# Patient Record
Sex: Male | Born: 1985
Health system: Southern US, Community
[De-identification: ages and names within clinical notes are randomized; demographics above are authoritative.]

---

## 2019-10-18 ENCOUNTER — Ambulatory Visit: Payer: Self-pay | Attending: Internal Medicine

## 2019-10-18 DIAGNOSIS — Z23 Encounter for immunization: Secondary | ICD-10-CM

## 2019-10-18 NOTE — Progress Notes (Signed)
   Covid-19 Vaccination [facility]  Name:  Wagner Tanzi    MRN: [id number] DOB: 1985/12/21  10/18/2019  Mr. Beckem Tomberlin was observed post Covid-19 immunization for 15 minutes without incident. He was provided with Vaccine Information Sheet and instruction to access the V-Safe system.   Mr. Asa Baudoin was instructed to call 911 with any severe reactions post vaccine: Marland Kitchen Difficulty breathing  . Swelling of face and throat  . A fast heartbeat  . A bad rash all over body  . Dizziness and weakness

## 2020-06-24 ENCOUNTER — Other Ambulatory Visit: Payer: Self-pay

## 2020-06-24 ENCOUNTER — Ambulatory Visit: Payer: 59 | Admitting: Internal Medicine

## 2020-06-24 ENCOUNTER — Encounter: Payer: Self-pay | Admitting: Internal Medicine

## 2020-06-24 VITALS — BP 123/75 | HR 61 | Temp 97.7°F | Resp 18 | Ht 67.0 in | Wt 168.0 lb

## 2020-06-24 DIAGNOSIS — Z1159 Encounter for screening for other viral diseases: Secondary | ICD-10-CM

## 2020-06-24 DIAGNOSIS — Z7689 Persons encountering health services in other specified circumstances: Secondary | ICD-10-CM

## 2020-06-24 DIAGNOSIS — R748 Abnormal levels of other serum enzymes: Secondary | ICD-10-CM

## 2020-06-24 NOTE — Progress Notes (Signed)
New Patient Office Visit  Subjective:  Patient ID: Juan Caldwell, male    DOB: 12/26/1985  Age: 35 y.o. MRN: [id number]  CC:  Chief Complaint  Patient presents with   New Patient (Initial Visit)    New patient has been awhile since  having a pcp just establishing care     HPI Juan Caldwell is a 35 year old male with no significant PMH who presents for establishing care.  He has been doing well overall. Reports h/o elevated liver enzymes and NAFLD in the past, but improved later with diet modification. Denies any nausea, vomiting, constipation or diarrhea.  He is up-to-date with COVID vaccine.  History reviewed. No pertinent past medical history.  History reviewed. No pertinent surgical history.  History reviewed. No pertinent family history.  Social History   Socioeconomic History   Marital status: Married    Spouse name: Not on file   Number of children: Not on file   Years of education: Not on file   Highest education level: Not on file  Occupational History   Not on file  Tobacco Use   Smoking status: Never   Smokeless tobacco: Never  Substance and Sexual Activity   Alcohol use: Not Currently   Drug use: Never   Sexual activity: Yes  Other Topics Concern   Not on file  Social History Narrative   Not on file   Social Determinants of Health   Financial Resource Strain: Not on file  Food Insecurity: Not on file  Transportation Needs: Not on file  Physical Activity: Not on file  Stress: Not on file  Social Connections: Not on file  Intimate Partner Violence: Not on file    ROS Review of Systems  Constitutional:  Negative for chills and fever.  HENT:  Negative for congestion and sore throat.   Eyes:  Negative for pain and discharge.  Respiratory:  Negative for cough and shortness of breath.   Cardiovascular:  Negative for chest pain and palpitations.  Gastrointestinal:  Negative for constipation, diarrhea, nausea and vomiting.   Endocrine: Negative for polydipsia and polyuria.  Genitourinary:  Negative for dysuria and hematuria.  Musculoskeletal:  Negative for neck pain and neck stiffness.  Skin:  Negative for rash.  Neurological:  Negative for dizziness, weakness, numbness and headaches.  Psychiatric/Behavioral:  Negative for agitation and behavioral problems.    Objective:   Today's Vitals: BP 123/75 (BP Location: Left Arm, Patient Position: Sitting, Cuff Size: Normal)   Pulse 61   Temp 97.7 F (36.5 C) (Oral)   Resp 18   Ht $R'5\' 7"'fV$  (1.702 m)   Wt 168 lb (76.2 kg)   SpO2 97%   BMI 26.31 kg/m   Physical Exam Vitals reviewed.  Constitutional:      General: He is not in acute distress.    Appearance: He is not diaphoretic.  HENT:     Head: Normocephalic and atraumatic.     Nose: Nose normal.     Mouth/Throat:     Mouth: Mucous membranes are moist.  Eyes:     General: No scleral icterus.    Extraocular Movements: Extraocular movements intact.  Cardiovascular:     Rate and Rhythm: Normal rate and regular rhythm.     Pulses: Normal pulses.     Heart sounds: Normal heart sounds. No murmur heard. Pulmonary:     Breath sounds: Normal breath sounds. No wheezing or rales.  Musculoskeletal:     Cervical back: Neck supple. No tenderness.  Right lower leg: No edema.     Left lower leg: No edema.  Skin:    General: Skin is warm.     Findings: No rash.  Neurological:     General: No focal deficit present.     Mental Status: He is alert and oriented to person, place, and time.  Psychiatric:        Mood and Affect: Mood normal.        Behavior: Behavior normal.    Assessment & Plan:   Problem List Items Addressed This Visit       Other   Encounter to establish care - Primary    Care established History and medications reviewed with the patient       Relevant Orders   CBC with Differential/Platelet   CMP14+EGFR   Lipid panel   TSH   Hemoglobin A1c   Elevated liver enzymes    Reports  h/o transaminitis and NAFLD, later improved with diet modification Recheck CMP       Other Visit Diagnoses     Need for hepatitis C screening test       Relevant Orders   Hepatitis C Antibody       No outpatient encounter medications on file as of 06/24/2020.   No facility-administered encounter medications on file as of 06/24/2020.    Follow-up: Return in about 1 year (around 06/24/2021) for Annual physical.   Lindell Spar, MD

## 2020-06-24 NOTE — Assessment & Plan Note (Signed)
Care established History and medications reviewed with the patient 

## 2020-06-24 NOTE — Patient Instructions (Signed)
Please continue to follow heart healthy diet.  Please get fasting blood tests done within a week.

## 2020-06-24 NOTE — Assessment & Plan Note (Signed)
Reports h/o transaminitis and NAFLD, later improved with diet modification Recheck CMP

## 2020-06-27 ENCOUNTER — Ambulatory Visit: Payer: Self-pay | Admitting: Internal Medicine

## 2020-07-04 ENCOUNTER — Ambulatory Visit (HOSPITAL_BASED_OUTPATIENT_CLINIC_OR_DEPARTMENT_OTHER): Payer: Self-pay | Admitting: Family Medicine

## 2020-07-04 ENCOUNTER — Other Ambulatory Visit (HOSPITAL_COMMUNITY): Payer: Self-pay

## 2020-07-04 DIAGNOSIS — Z3009 Encounter for other general counseling and advice on contraception: Secondary | ICD-10-CM | POA: Diagnosis not present

## 2020-07-04 MED ORDER — DIAZEPAM 10 MG PO TABS
ORAL_TABLET | ORAL | 0 refills | Status: DC
Start: 2020-07-04 — End: 2021-08-05
  Filled 2020-07-04: qty 2, 1d supply, fill #0

## 2020-08-22 ENCOUNTER — Other Ambulatory Visit (HOSPITAL_COMMUNITY): Payer: Self-pay

## 2020-08-22 DIAGNOSIS — Z302 Encounter for sterilization: Secondary | ICD-10-CM | POA: Diagnosis not present

## 2020-08-22 MED ORDER — TRAMADOL HCL 50 MG PO TABS
ORAL_TABLET | ORAL | 0 refills | Status: DC
Start: 2020-08-22 — End: 2021-08-05
  Filled 2020-08-22: qty 10, 2d supply, fill #0

## 2020-09-01 ENCOUNTER — Other Ambulatory Visit (HOSPITAL_COMMUNITY): Payer: Self-pay

## 2020-09-01 DIAGNOSIS — Z7689 Persons encountering health services in other specified circumstances: Secondary | ICD-10-CM | POA: Diagnosis not present

## 2020-09-01 DIAGNOSIS — Z1159 Encounter for screening for other viral diseases: Secondary | ICD-10-CM | POA: Diagnosis not present

## 2020-09-02 LAB — CBC WITH DIFFERENTIAL/PLATELET
Basophils Absolute: 0.1 10*3/uL (ref 0.0–0.2)
Basos: 1 %
EOS (ABSOLUTE): 0.2 10*3/uL (ref 0.0–0.4)
Eos: 3 %
Hematocrit: 44.3 % (ref 37.5–51.0)
Hemoglobin: 16.2 g/dL (ref 13.0–17.7)
Immature Grans (Abs): 0 10*3/uL (ref 0.0–0.1)
Immature Granulocytes: 0 %
Lymphocytes Absolute: 2.1 10*3/uL (ref 0.7–3.1)
Lymphs: 34 %
MCH: 30.3 pg (ref 26.6–33.0)
MCHC: 36.6 g/dL — ABNORMAL HIGH (ref 31.5–35.7)
MCV: 83 fL (ref 79–97)
Monocytes Absolute: 0.6 10*3/uL (ref 0.1–0.9)
Monocytes: 10 %
Neutrophils Absolute: 3.2 10*3/uL (ref 1.4–7.0)
Neutrophils: 52 %
Platelets: 252 10*3/uL (ref 150–450)
RBC: 5.34 x10E6/uL (ref 4.14–5.80)
RDW: 12.2 % (ref 11.6–15.4)
WBC: 6.1 10*3/uL (ref 3.4–10.8)

## 2020-09-02 LAB — CMP14+EGFR
ALT: 18 IU/L (ref 0–44)
AST: 18 IU/L (ref 0–40)
Albumin/Globulin Ratio: 2.2 (ref 1.2–2.2)
Albumin: 4.8 g/dL (ref 4.0–5.0)
Alkaline Phosphatase: 83 IU/L (ref 44–121)
BUN/Creatinine Ratio: 15 (ref 9–20)
BUN: 15 mg/dL (ref 6–20)
Bilirubin Total: 0.7 mg/dL (ref 0.0–1.2)
CO2: 24 mmol/L (ref 20–29)
Calcium: 10 mg/dL (ref 8.7–10.2)
Chloride: 103 mmol/L (ref 96–106)
Creatinine, Ser: 0.98 mg/dL (ref 0.76–1.27)
Globulin, Total: 2.2 g/dL (ref 1.5–4.5)
Glucose: 96 mg/dL (ref 65–99)
Potassium: 4.2 mmol/L (ref 3.5–5.2)
Sodium: 136 mmol/L (ref 134–144)
Total Protein: 7 g/dL (ref 6.0–8.5)
eGFR: 105 mL/min/{1.73_m2} (ref >59)

## 2020-09-02 LAB — LIPID PANEL
Chol/HDL Ratio: 3.2 ratio (ref 0.0–5.0)
Cholesterol, Total: 149 mg/dL (ref 100–199)
HDL: 46 mg/dL (ref >39)
LDL Chol Calc (NIH): 88 mg/dL (ref 0–99)
Triglycerides: 74 mg/dL (ref 0–149)
VLDL Cholesterol Cal: 15 mg/dL (ref 5–40)

## 2020-09-02 LAB — HEMOGLOBIN A1C
Est. average glucose Bld gHb Est-mCnc: 100 mg/dL
Hgb A1c MFr Bld: 5.1 % (ref 4.8–5.6)

## 2020-09-02 LAB — TSH: TSH: 1.42 u[IU]/mL (ref 0.450–4.500)

## 2020-10-24 DIAGNOSIS — H5213 Myopia, bilateral: Secondary | ICD-10-CM | POA: Diagnosis not present

## 2020-10-24 DIAGNOSIS — H52203 Unspecified astigmatism, bilateral: Secondary | ICD-10-CM | POA: Diagnosis not present

## 2020-12-08 ENCOUNTER — Encounter: Payer: Self-pay | Admitting: Internal Medicine

## 2020-12-09 ENCOUNTER — Other Ambulatory Visit: Payer: Self-pay | Admitting: Internal Medicine

## 2020-12-09 ENCOUNTER — Other Ambulatory Visit (HOSPITAL_COMMUNITY): Payer: Self-pay

## 2020-12-09 DIAGNOSIS — K219 Gastro-esophageal reflux disease without esophagitis: Secondary | ICD-10-CM

## 2020-12-09 DIAGNOSIS — L209 Atopic dermatitis, unspecified: Secondary | ICD-10-CM

## 2020-12-09 MED ORDER — MOMETASONE FUROATE 0.1 % EX CREA
1.0000 "application " | TOPICAL_CREAM | Freq: Every day | CUTANEOUS | 0 refills | Status: DC
  Filled 2020-12-09 – 2020-12-10 (×2): qty 45, 30d supply, fill #0

## 2020-12-09 MED ORDER — OMEPRAZOLE 20 MG PO CPDR
20.0000 mg | DELAYED_RELEASE_CAPSULE | Freq: Every day | ORAL | 3 refills | Status: DC
  Filled 2020-12-09 – 2020-12-10 (×2): qty 90, 90d supply, fill #0

## 2020-12-10 ENCOUNTER — Other Ambulatory Visit (HOSPITAL_COMMUNITY): Payer: Self-pay

## 2021-04-30 ENCOUNTER — Telehealth: Payer: Self-pay

## 2021-04-30 NOTE — Telephone Encounter (Signed)
Patient called in to see if he could be seen tomorrow I offered him a 8 or 1030 at this time he stated that he has patients and could only do after 12 tomorrow. He would like to know if we have any cancellations if he can get a call to move his appt  ? ?Please advise   ?

## 2021-05-01 ENCOUNTER — Ambulatory Visit (HOSPITAL_BASED_OUTPATIENT_CLINIC_OR_DEPARTMENT_OTHER): Payer: 59 | Admitting: Orthopaedic Surgery

## 2021-05-01 ENCOUNTER — Ambulatory Visit (HOSPITAL_BASED_OUTPATIENT_CLINIC_OR_DEPARTMENT_OTHER)
Admission: RE | Admit: 2021-05-01 | Discharge: 2021-05-01 | Disposition: A | Discharge status: Home / Self Care | Payer: 59 | Source: Ambulatory Visit | Attending: Orthopaedic Surgery | Admitting: Orthopaedic Surgery

## 2021-05-01 ENCOUNTER — Other Ambulatory Visit (HOSPITAL_BASED_OUTPATIENT_CLINIC_OR_DEPARTMENT_OTHER): Payer: Self-pay | Admitting: Orthopaedic Surgery

## 2021-05-01 DIAGNOSIS — M7631 Iliotibial band syndrome, right leg: Secondary | ICD-10-CM

## 2021-05-01 DIAGNOSIS — M25561 Pain in right knee: Secondary | ICD-10-CM

## 2021-05-01 MED ORDER — TRIAMCINOLONE ACETONIDE 40 MG/ML IJ SUSP
80.0000 mg | INTRAMUSCULAR | Status: AC | PRN
  Administered 2021-05-01: 80 mg via INTRA_ARTICULAR

## 2021-05-01 MED ORDER — LIDOCAINE HCL 1 % IJ SOLN
4.0000 mL | INTRAMUSCULAR | Status: AC | PRN
  Administered 2021-05-01: 4 mL

## 2021-05-01 NOTE — Progress Notes (Signed)
? ?                            ? ? ?Chief Complaint: Right knee pain ?  ? ? ?History of Present Illness:  ? ? ?Juan Caldwell is a 36 y.o. male presents with right which has been ongoing for the last several weeks.  He does not have a specific injury or incident.  He has a GI doctor in [place].  He is here today as he has been more recent recently playing tennis and has noted snapping and popping about the lateral aspect of the knee.  He states that there is pain and soreness associated with the popping.  Denies any history of knee injury.  He has been limited in his ability to play tennis as result of this knee pain. ? ? ? ?Surgical History:   ?None ? ?PMH/PSH/Family History/Social History/Meds/Allergies:   ?No past medical history on file. ?No past surgical history on file. ?Social History  ? ?Socioeconomic History  ?? Marital status: Married  ?  Spouse name: Not on file  ?? Number of children: Not on file  ?? Years of education: Not on file  ?? Highest education level: Not on file  ?Occupational History  ?? Not on file  ?Tobacco Use  ?? Smoking status: Never  ?? Smokeless tobacco: Never  ?Substance and Sexual Activity  ?? Alcohol use: Not Currently  ?? Drug use: Never  ?? Sexual activity: Yes  ?Other Topics Concern  ?? Not on file  ?Social History Narrative  ?? Not on file  ? ?Social Determinants of Health  ? ?Financial Resource Strain: Not on file  ?Food Insecurity: Not on file  ?Transportation Needs: Not on file  ?Physical Activity: Not on file  ?Stress: Not on file  ?Social Connections: Not on file  ? ?No family history on file. ?Not on File ?Current Outpatient Medications  ?Medication Sig Dispense Refill  ?? mometasone (ELOCON) 0.1 % cream Apply topically daily. 45 g 0  ?? omeprazole (PRILOSEC) 20 MG capsule Take 1 capsule (20 mg total) by mouth daily. 90 capsule 3  ?? diazepam (VALIUM) 10 MG tablet Take 2 tablets by mouth once 1 hour prior to procedure 2 tablet 0  ?? traMADol (ULTRAM) 50 MG  tablet Take 1 to 2 tablets by mouth every 6 hours 10 tablet 0  ? ?No current facility-administered medications for this visit.  ? ?No results found. ? ?Review of Systems:   ?A ROS was performed including pertinent positives and negatives as documented in the HPI. ? ?Physical Exam :   ?Constitutional: NAD and appears stated age ?Neurological: Alert and oriented ?Psych: Appropriate affect and cooperative ?There were no vitals taken for this visit.  ? ?Comprehensive Musculoskeletal Exam:   ? ?  ?Musculoskeletal Exam  ?Gait Normal  ?Alignment Normal  ? Right Left  ?Inspection Normal Normal  ?Palpation    ?Tenderness IT band laterally None  ?Crepitus None None  ?Effusion None None  ?Range of Motion    ?Extension 0 0  ?Flexion 135 135  ?Strength    ?Extension 5/5 5/5  ?Flexion 5/5 5/5  ?Ligament Exam     ?Generalized Laxity No No  ?Lachman Negative Negative   ?Pivot Shift Negative Negative  ?Anterior Drawer Negative Negative  ?Valgus at 0 Negative Negative  ?Valgus at 20 Negative Negative  ?Varus at 0 0 0  ?Varus at 20   0 0  ?Posterior  Drawer at 90 0 0  ?Vascular/Lymphatic Exam    ?Edema None None  ?Venous Stasis Changes No No  ?Distal Circulation Normal Normal  ?Neurologic    ?Light Touch Sensation Intact Intact  ?Special Tests: Positive palpable clicking as the knee is brought from flexion into extension about the IT band over the lateral femoral condyle  ? ? ? ?Imaging:   ?Xray (4 views right knee): ?Normal ? ? ? ?I personally reviewed and interpreted the radiographs. ? ? ?Assessment:   ?36 y.o. male with right knee IT band friction syndrome which has been ongoing as he has been more active and tennis.  At this time I have recommended IT band stretching by applying a tennis ball into the lateral knee/IT band.  I have also recommended a side to IT band ultrasound-guided injection which she would like to perform today.  I described that the typical course is self-limiting and that these typically resolve over time with  an injection.  He would like to try this today. ? ?Plan :   ? ?-Right knee ultrasound-guided injection performed today after verbal consent obtained ? ?Procedure Note ? ?Patient: Juan Caldwell             ?Date of Birth: Dec 23, 1985           ?MRN: [id number]             ?Visit Date: 05/01/2021 ? ?Procedures: ?Visit Diagnoses: No diagnosis found. ? ?Large Joint Inj on 05/01/2021 12:27 PM ?Indications: pain ?Details: 22 G 1.5 in needle, ultrasound-guided anterior approach ? ?Arthrogram: No ? ?Medications: 4 mL lidocaine 1 %; 80 mg triamcinolone acetonide 40 MG/ML ?Outcome: tolerated well, no immediate complications ?Procedure, treatment alternatives, risks and benefits explained, specific risks discussed. Consent was given by the patient. Immediately prior to procedure a time out was called to verify the correct patient, procedure, equipment, support staff and site/side marked as required. Patient was prepped and draped in the usual sterile fashion.  ? ? ? ? ? ? ? ? ?I personally saw and evaluated the patient, and participated in the management and treatment plan. ? ?Huel Cote, MD ?Attending Physician, Orthopedic Surgery ? ?This document was dictated using Conservation officer, historic buildings. A reasonable attempt at proof reading has been made to minimize errors. ?

## 2021-05-08 ENCOUNTER — Ambulatory Visit (HOSPITAL_BASED_OUTPATIENT_CLINIC_OR_DEPARTMENT_OTHER): Payer: 59 | Admitting: Orthopaedic Surgery

## 2021-06-30 ENCOUNTER — Encounter: Payer: 59 | Admitting: Internal Medicine

## 2021-07-13 ENCOUNTER — Encounter: Payer: 59 | Admitting: Internal Medicine

## 2021-08-05 ENCOUNTER — Ambulatory Visit (INDEPENDENT_AMBULATORY_CARE_PROVIDER_SITE_OTHER): Payer: 59 | Admitting: Internal Medicine

## 2021-08-05 ENCOUNTER — Encounter: Payer: Self-pay | Admitting: Internal Medicine

## 2021-08-05 VITALS — BP 118/84 | HR 64 | Resp 18 | Ht 67.0 in | Wt 164.2 lb

## 2021-08-05 DIAGNOSIS — E559 Vitamin D deficiency, unspecified: Secondary | ICD-10-CM | POA: Diagnosis not present

## 2021-08-05 DIAGNOSIS — K219 Gastro-esophageal reflux disease without esophagitis: Secondary | ICD-10-CM | POA: Diagnosis not present

## 2021-08-05 DIAGNOSIS — L309 Dermatitis, unspecified: Secondary | ICD-10-CM | POA: Diagnosis not present

## 2021-08-05 DIAGNOSIS — Z0001 Encounter for general adult medical examination with abnormal findings: Secondary | ICD-10-CM | POA: Insufficient documentation

## 2021-08-05 NOTE — Progress Notes (Signed)
Established Patient Office Visit  Subjective:  Patient ID: Juan Caldwell, male    DOB: December 15, 1985  Age: 36 y.o. MRN: [id number]  CC:  Chief Complaint  Patient presents with   Annual Exam    Annual exam     HPI Juan Caldwell is a 36 y.o. male with past medical history of GERD and eczema who presents for annual physical.  He reports rash over face area, near angle of lips and below lower lip. He has been applying Mometasone cream with some relief, but recurs. He also has rash over L > R knee, but no rash over elbows or extensor surfaces of UE or LE.  He has been doing well otherwise.      History reviewed. No pertinent past medical history.  History reviewed. No pertinent surgical history.  History reviewed. No pertinent family history.  Social History   Socioeconomic History   Marital status: Married    Spouse name: Not on file   Number of children: Not on file   Years of education: Not on file   Highest education level: Not on file  Occupational History   Not on file  Tobacco Use   Smoking status: Never   Smokeless tobacco: Never  Substance and Sexual Activity   Alcohol use: Not Currently   Drug use: Never   Sexual activity: Yes  Other Topics Concern   Not on file  Social History Narrative   Not on file   Social Determinants of Health   Financial Resource Strain: Not on file  Food Insecurity: Not on file  Transportation Needs: Not on file  Physical Activity: Not on file  Stress: Not on file  Social Connections: Not on file  Intimate Partner Violence: Not on file    Outpatient Medications Prior to Visit  Medication Sig Dispense Refill   mometasone (ELOCON) 0.1 % cream Apply topically daily. 45 g 0   omeprazole (PRILOSEC) 20 MG capsule Take 1 capsule (20 mg total) by mouth daily. 90 capsule 3   diazepam (VALIUM) 10 MG tablet Take 2 tablets by mouth once 1 hour prior to procedure 2 tablet 0   traMADol (ULTRAM) 50 MG tablet Take 1  to 2 tablets by mouth every 6 hours 10 tablet 0   No facility-administered medications prior to visit.    Not on File  ROS Review of Systems  Constitutional:  Negative for chills and fever.  HENT:  Negative for congestion and sore throat.   Eyes:  Negative for pain and discharge.  Respiratory:  Negative for cough and shortness of breath.   Cardiovascular:  Negative for chest pain and palpitations.  Gastrointestinal:  Negative for constipation, diarrhea, nausea and vomiting.  Endocrine: Negative for polydipsia and polyuria.  Genitourinary:  Negative for dysuria and hematuria.  Musculoskeletal:  Negative for neck pain and neck stiffness.  Skin:  Positive for rash.  Neurological:  Negative for dizziness, weakness, numbness and headaches.  Psychiatric/Behavioral:  Negative for agitation and behavioral problems.       Objective:    Physical Exam Vitals reviewed.  Constitutional:      General: He is not in acute distress.    Appearance: He is not diaphoretic.  HENT:     Head: Normocephalic and atraumatic.     Nose: Nose normal.     Mouth/Throat:     Mouth: Mucous membranes are moist.  Eyes:     General: No scleral icterus.    Extraocular Movements: Extraocular movements intact.  Cardiovascular:     Rate and Rhythm: Normal rate and regular rhythm.     Pulses: Normal pulses.     Heart sounds: Normal heart sounds. No murmur heard. Pulmonary:     Breath sounds: Normal breath sounds. No wheezing or rales.  Musculoskeletal:     Cervical back: Neck supple. No tenderness.     Right lower leg: No edema.     Left lower leg: No edema.  Skin:    General: Skin is warm.     Findings: Rash (Erythematous over face - near angles of lip and below lower lip) present.  Neurological:     General: No focal deficit present.     Mental Status: He is alert and oriented to person, place, and time.  Psychiatric:        Mood and Affect: Mood normal.        Behavior: Behavior normal.     BP  118/84 (BP Location: Right Arm, Patient Position: Sitting, Cuff Size: Normal)   Pulse 64   Resp 18   Ht _0  (1.702 m)   Wt 164 lb 3.2 oz (74.5 kg)   SpO2 99%   BMI 25.72 kg/m  Wt Readings from Last 3 Encounters:  08/05/21 164 lb 3.2 oz (74.5 kg)  06/24/20 168 lb (76.2 kg)    Lab Results  Component Value Date   TSH 1.420 09/01/2020   Lab Results  Component Value Date   WBC 6.1 09/01/2020   HGB 16.2 09/01/2020   HCT 44.3 09/01/2020   MCV 83 09/01/2020   PLT 252 09/01/2020   Lab Results  Component Value Date   NA 136 09/01/2020   K 4.2 09/01/2020   CO2 24 09/01/2020   GLUCOSE 96 09/01/2020   BUN 15 09/01/2020   CREATININE 0.98 09/01/2020   BILITOT 0.7 09/01/2020   ALKPHOS 83 09/01/2020   AST 18 09/01/2020   ALT 18 09/01/2020   PROT 7.0 09/01/2020   ALBUMIN 4.8 09/01/2020   CALCIUM 10.0 09/01/2020   EGFR 105 09/01/2020   Lab Results  Component Value Date   CHOL 149 09/01/2020   Lab Results  Component Value Date   HDL 46 09/01/2020   Lab Results  Component Value Date   LDLCALC 88 09/01/2020   Lab Results  Component Value Date   TRIG 74 09/01/2020   Lab Results  Component Value Date   CHOLHDL 3.2 09/01/2020   Lab Results  Component Value Date   HGBA1C 5.1 09/01/2020      Assessment & Plan:   Problem List Items Addressed This Visit       Digestive   Gastroesophageal reflux disease without esophagitis    Well-compared with Omeprazole        Musculoskeletal and Integument   Eczema    Slightly better with Mometasone Referred to Dermatology      Relevant Orders   Ambulatory referral to Dermatology     Other   Encounter for general adult medical examination with abnormal findings - Primary    Physical exam as documented. Fasting blood tests ordered.      Relevant Orders   VITAMIN D 25 Hydroxy (Vit-D Deficiency, Fractures)   TSH   Hemoglobin A1c   CMP14+EGFR   CBC with Differential/Platelet   Lipid Profile   Other Visit  Diagnoses     Vitamin D deficiency       Relevant Orders   VITAMIN D 25 Hydroxy (Vit-D Deficiency, Fractures)  No orders of the defined types were placed in this encounter.   Follow-up: Return if symptoms worsen or fail to improve.    Lindell Spar, MD

## 2021-08-05 NOTE — Assessment & Plan Note (Signed)
Physical exam as documented. Fasting blood tests ordered. 

## 2021-08-05 NOTE — Assessment & Plan Note (Signed)
Slightly better with Mometasone Referred to Dermatology

## 2021-08-05 NOTE — Assessment & Plan Note (Signed)
Well-compared with Omeprazole

## 2021-08-20 ENCOUNTER — Ambulatory Visit (INDEPENDENT_AMBULATORY_CARE_PROVIDER_SITE_OTHER): Payer: 59 | Admitting: Internal Medicine

## 2021-08-20 ENCOUNTER — Encounter: Payer: Self-pay | Admitting: Internal Medicine

## 2021-08-20 ENCOUNTER — Other Ambulatory Visit (HOSPITAL_COMMUNITY): Payer: Self-pay

## 2021-08-20 VITALS — BP 108/72 | HR 47 | Temp 97.5°F

## 2021-08-20 DIAGNOSIS — R109 Unspecified abdominal pain: Secondary | ICD-10-CM

## 2021-08-20 DIAGNOSIS — R102 Pelvic and perineal pain: Secondary | ICD-10-CM | POA: Diagnosis not present

## 2021-08-20 LAB — POCT URINALYSIS DIP (CLINITEK)
Bilirubin, UA: NEGATIVE
Blood, UA: NEGATIVE
Glucose, UA: NEGATIVE mg/dL
Ketones, POC UA: NEGATIVE mg/dL
Leukocytes, UA: NEGATIVE
Nitrite, UA: NEGATIVE
POC PROTEIN,UA: 100 — AB
Spec Grav, UA: 1.025 (ref 1.010–1.025)
Urobilinogen, UA: 1 E.U./dL
pH, UA: 6 (ref 5.0–8.0)

## 2021-08-20 MED ORDER — DICYCLOMINE HCL 10 MG PO CAPS
10.0000 mg | ORAL_CAPSULE | Freq: Three times a day (TID) | ORAL | 0 refills | Status: DC
  Filled 2021-08-20: qty 30, 8d supply, fill #0

## 2021-08-20 NOTE — Patient Instructions (Signed)
Please take Bentyl as needed for abdominal cramps.

## 2021-08-20 NOTE — Progress Notes (Signed)
Acute Office Visit  Subjective:    Patient ID: Stevenson Windmiller, male    DOB: October 24, 1985, 36 y.o.   MRN: [id number]  Chief Complaint  Patient presents with   Abdominal Pain    Starting 08-19-21 patient has lower abdomen toward the pelvic pain it comes and goes     HPI Patient is in today for complaint of suprapubic pain and abdominal cramping, sudden onset for the last 3-4 hours.  Pain is intermittent, worse with movement and is sharp, cramping.  He reports taking coffee last night, which is unusual for him.  He has had 2 BMs today, regular consistency.  Denies any melena or hematochezia.  Denies any dysuria, hematuria or scrotal pain.  Denies any nausea or vomiting.  History reviewed. No pertinent past medical history.  History reviewed. No pertinent surgical history.  History reviewed. No pertinent family history.  Social History   Socioeconomic History   Marital status: Married    Spouse name: Not on file   Number of children: Not on file   Years of education: Not on file   Highest education level: Not on file  Occupational History   Not on file  Tobacco Use   Smoking status: Never   Smokeless tobacco: Never  Substance and Sexual Activity   Alcohol use: Not Currently   Drug use: Never   Sexual activity: Yes  Other Topics Concern   Not on file  Social History Narrative   Not on file   Social Determinants of Health   Financial Resource Strain: Not on file  Food Insecurity: Not on file  Transportation Needs: Not on file  Physical Activity: Not on file  Stress: Not on file  Social Connections: Not on file  Intimate Partner Violence: Not on file    Outpatient Medications Prior to Visit  Medication Sig Dispense Refill   mometasone (ELOCON) 0.1 % cream Apply topically daily. 45 g 0   omeprazole (PRILOSEC) 20 MG capsule Take 1 capsule (20 mg total) by mouth daily. 90 capsule 3   No facility-administered medications prior to visit.    Not on  File  Review of Systems  Constitutional:  Negative for chills and fever.  Respiratory:  Negative for cough and shortness of breath.   Cardiovascular:  Negative for chest pain and palpitations.  Gastrointestinal:  Positive for abdominal pain. Negative for nausea and vomiting.  Genitourinary:  Negative for dysuria and hematuria.  Skin:  Negative for rash.       Objective:    Physical Exam Constitutional:      General: He is not in acute distress.    Appearance: He is not diaphoretic.  Cardiovascular:     Heart sounds: Normal heart sounds. No murmur heard. Pulmonary:     Breath sounds: Normal breath sounds. No wheezing or rales.  Abdominal:     Palpations: Abdomen is soft.     Tenderness: There is abdominal tenderness (Mild, suprapubic).  Neurological:     General: No focal deficit present.     Mental Status: He is alert and oriented to person, place, and time.  Psychiatric:        Mood and Affect: Mood normal.        Behavior: Behavior normal.     BP 108/72 (BP Location: Right Arm, Patient Position: Sitting, Cuff Size: Normal)   Pulse (!) 47   Temp (!) 97.5 F (36.4 C) (Temporal)   SpO2 99%  Wt Readings from Last 3 Encounters:  08/05/21  164 lb 3.2 oz (74.5 kg)  06/24/20 168 lb (76.2 kg)        Assessment & Plan:   Problem List Items Addressed This Visit    Visit Diagnoses     Abdominal cramps    -  Primary Unclear etiology UA reviewed as he had suprapubic pain, UA negative for LE or nitrite Regular Bms Bentyl as needed for abdominal cramps If persistent or worsening of symptoms, may need CT abdomen   Relevant Medications   dicyclomine (BENTYL) 10 MG capsule   Suprapubic pain       Relevant Orders   POCT URINALYSIS DIP (CLINITEK) (Completed)        Meds ordered this encounter  Medications   dicyclomine (BENTYL) 10 MG capsule    Sig: Take 1 capsule (10 mg total) by mouth 4 (four) times daily -  before meals and at bedtime.    Dispense:  30 capsule     Refill:  0     Ndeye Tenorio Concha Se, MD

## 2021-08-21 ENCOUNTER — Other Ambulatory Visit (HOSPITAL_COMMUNITY): Payer: Self-pay

## 2021-08-24 ENCOUNTER — Other Ambulatory Visit (HOSPITAL_COMMUNITY): Payer: Self-pay

## 2021-08-24 ENCOUNTER — Other Ambulatory Visit: Payer: Self-pay | Admitting: Internal Medicine

## 2021-08-24 DIAGNOSIS — N451 Epididymitis: Secondary | ICD-10-CM

## 2021-08-24 MED ORDER — LEVOFLOXACIN 500 MG PO TABS
500.0000 mg | ORAL_TABLET | Freq: Every day | ORAL | 0 refills | Status: DC
  Filled 2021-08-24: qty 10, 10d supply, fill #0

## 2021-08-28 ENCOUNTER — Encounter: Payer: Self-pay | Admitting: Internal Medicine

## 2021-08-28 ENCOUNTER — Other Ambulatory Visit: Payer: Self-pay | Admitting: Internal Medicine

## 2021-08-28 ENCOUNTER — Other Ambulatory Visit (HOSPITAL_COMMUNITY): Payer: Self-pay

## 2021-08-28 DIAGNOSIS — N451 Epididymitis: Secondary | ICD-10-CM

## 2021-08-28 LAB — BODY FLUID CULTURE

## 2021-08-28 MED ORDER — AMOXICILLIN 875 MG PO TABS
875.0000 mg | ORAL_TABLET | Freq: Two times a day (BID) | ORAL | 0 refills | Status: AC
  Filled 2021-08-28: qty 56, 28d supply, fill #0

## 2021-09-10 ENCOUNTER — Encounter: Payer: Self-pay | Admitting: Internal Medicine

## 2021-09-10 ENCOUNTER — Other Ambulatory Visit: Payer: Self-pay

## 2021-09-10 DIAGNOSIS — L309 Dermatitis, unspecified: Secondary | ICD-10-CM

## 2022-04-27 ENCOUNTER — Other Ambulatory Visit: Payer: Self-pay | Admitting: Internal Medicine

## 2022-04-27 ENCOUNTER — Other Ambulatory Visit (HOSPITAL_COMMUNITY): Payer: Self-pay

## 2022-04-27 DIAGNOSIS — M545 Low back pain, unspecified: Secondary | ICD-10-CM

## 2022-04-27 MED ORDER — CYCLOBENZAPRINE HCL 5 MG PO TABS
5.0000 mg | ORAL_TABLET | Freq: Three times a day (TID) | ORAL | 0 refills | Status: DC | PRN
  Filled 2022-04-27: qty 30, 10d supply, fill #0

## 2022-06-30 ENCOUNTER — Other Ambulatory Visit: Payer: Self-pay

## 2022-06-30 ENCOUNTER — Other Ambulatory Visit: Payer: Self-pay | Admitting: Internal Medicine

## 2022-06-30 ENCOUNTER — Encounter: Payer: Self-pay | Admitting: Internal Medicine

## 2022-06-30 ENCOUNTER — Other Ambulatory Visit (HOSPITAL_COMMUNITY): Payer: Self-pay

## 2022-06-30 DIAGNOSIS — K219 Gastro-esophageal reflux disease without esophagitis: Secondary | ICD-10-CM

## 2022-06-30 MED ORDER — OMEPRAZOLE 20 MG PO CPDR
20.0000 mg | DELAYED_RELEASE_CAPSULE | Freq: Every day | ORAL | 3 refills | Status: DC
Start: 2022-06-30 — End: 2023-04-27
  Filled 2022-06-30: qty 90, 90d supply, fill #0

## 2022-08-08 DIAGNOSIS — Z23 Encounter for immunization: Secondary | ICD-10-CM | POA: Diagnosis not present

## 2022-08-11 ENCOUNTER — Encounter: Payer: Self-pay | Admitting: Internal Medicine

## 2022-08-11 ENCOUNTER — Ambulatory Visit (INDEPENDENT_AMBULATORY_CARE_PROVIDER_SITE_OTHER): Payer: Commercial Managed Care - PPO | Admitting: Internal Medicine

## 2022-08-11 VITALS — BP 121/76 | HR 63 | Ht 67.0 in | Wt 170.0 lb

## 2022-08-11 DIAGNOSIS — E782 Mixed hyperlipidemia: Secondary | ICD-10-CM

## 2022-08-11 DIAGNOSIS — E559 Vitamin D deficiency, unspecified: Secondary | ICD-10-CM

## 2022-08-11 DIAGNOSIS — Z0001 Encounter for general adult medical examination with abnormal findings: Secondary | ICD-10-CM

## 2022-08-11 DIAGNOSIS — L309 Dermatitis, unspecified: Secondary | ICD-10-CM | POA: Diagnosis not present

## 2022-08-11 DIAGNOSIS — R739 Hyperglycemia, unspecified: Secondary | ICD-10-CM | POA: Diagnosis not present

## 2022-08-11 DIAGNOSIS — K219 Gastro-esophageal reflux disease without esophagitis: Secondary | ICD-10-CM

## 2022-08-11 NOTE — Patient Instructions (Signed)
Please continue to take medications as prescribed. ? ?Please continue to follow low carb diet and perform moderate exercise/walking at least 150 mins/week. ?

## 2022-08-11 NOTE — Assessment & Plan Note (Signed)
Well-compared with Omeprazole

## 2022-08-11 NOTE — Assessment & Plan Note (Signed)
Improved now with OTC nonsteroidal cream

## 2022-08-11 NOTE — Assessment & Plan Note (Signed)
Physical exam as documented. Fasting blood tests ordered. 

## 2022-08-11 NOTE — Progress Notes (Signed)
Established Patient Office Visit  Subjective:  Patient ID: Juan Caldwell, male    DOB: [birth year]  Age: 37 y.o. MRN: [id number]  CC:  Chief Complaint  Patient presents with   Annual Exam    HPI Juan Caldwell is a 37 y.o. male with past medical history of GERD and eczema who presents for annual physical.  He has been doing well otherwise.   He has Omeprazole PRN for GERD. No dysphagia, nausea or vomiting reported.  He had an episode of epididymitis after vasectomy, which has resolved with antibiotics.    History reviewed. No pertinent past medical history.  History reviewed. No pertinent surgical history.  Family History  Problem Relation Age of Onset   Asthma Father    Asthma Brother     Social History   Socioeconomic History   Marital status: Married    Spouse name: Not on file   Number of children: Not on file   Years of education: Not on file   Highest education level: Not on file  Occupational History   Not on file  Tobacco Use   Smoking status: Never   Smokeless tobacco: Never   Tobacco comments:    never smoked  Substance and Sexual Activity   Alcohol use: Yes    Alcohol/week: 2.0 standard drinks of alcohol    Types: 2 Cans of beer per week   Drug use: Never   Sexual activity: Yes    Birth control/protection: Surgical    Comment: Vasectomy  Other Topics Concern   Not on file  Social History Narrative   Not on file   Social Determinants of Health   Financial Resource Strain: Not on file  Food Insecurity: Not on file  Transportation Needs: Not on file  Physical Activity: Not on file  Stress: Not on file  Social Connections: Not on file  Intimate Partner Violence: Not on file    Outpatient Medications Prior to Visit  Medication Sig Dispense Refill   omeprazole (PRILOSEC) 20 MG capsule Take 1 capsule (20 mg total) by mouth daily. 90 capsule 3   cyclobenzaprine (FLEXERIL) 5 MG tablet Take 1 tablet (5 mg total) by mouth  3 (three) times daily as needed for muscle spasms. 30 tablet 0   dicyclomine (BENTYL) 10 MG capsule Take 1 capsule (10 mg total) by mouth 4 (four) times daily -  before meals and at bedtime. 30 capsule 0   mometasone (ELOCON) 0.1 % cream Apply topically daily. 45 g 0   No facility-administered medications prior to visit.    Not on File  ROS Review of Systems  Constitutional:  Negative for chills and fever.  HENT:  Negative for congestion and sore throat.   Eyes:  Negative for pain and discharge.  Respiratory:  Negative for cough and shortness of breath.   Cardiovascular:  Negative for chest pain and palpitations.  Gastrointestinal:  Negative for constipation, diarrhea, nausea and vomiting.  Endocrine: Negative for polydipsia and polyuria.  Genitourinary:  Negative for dysuria and hematuria.  Musculoskeletal:  Negative for neck pain and neck stiffness.  Skin:  Negative for rash.  Neurological:  Negative for dizziness, weakness, numbness and headaches.  Psychiatric/Behavioral:  Negative for agitation and behavioral problems.       Objective:    Physical Exam Vitals reviewed.  Constitutional:      General: He is not in acute distress.    Appearance: He is not diaphoretic.  HENT:     Head: Normocephalic and  atraumatic.     Nose: Nose normal.     Mouth/Throat:     Mouth: Mucous membranes are moist.  Eyes:     General: No scleral icterus.    Extraocular Movements: Extraocular movements intact.  Cardiovascular:     Rate and Rhythm: Normal rate and regular rhythm.     Pulses: Normal pulses.     Heart sounds: Normal heart sounds. No murmur heard. Pulmonary:     Breath sounds: Normal breath sounds. No wheezing or rales.  Abdominal:     Palpations: Abdomen is soft.     Tenderness: There is no abdominal tenderness.  Musculoskeletal:     Cervical back: Neck supple. No tenderness.     Right lower leg: No edema.     Left lower leg: No edema.  Skin:    General: Skin is warm.      Findings: No rash.  Neurological:     General: No focal deficit present.     Mental Status: He is alert and oriented to person, place, and time.     Sensory: No sensory deficit.     Motor: No weakness.  Psychiatric:        Mood and Affect: Mood normal.        Behavior: Behavior normal.     BP 121/76   Pulse 63   Ht 5\' 7"  (1.702 m)   Wt 170 lb (77.1 kg)   SpO2 97%   BMI 26.63 kg/m  Wt Readings from Last 3 Encounters:  08/11/22 170 lb (77.1 kg)  08/05/21 164 lb 3.2 oz (74.5 kg)  06/24/20 168 lb (76.2 kg)    Lab Results  Component Value Date   TSH 1.420 09/01/2020   Lab Results  Component Value Date   WBC 6.1 09/01/2020   HGB 16.2 09/01/2020   HCT 44.3 09/01/2020   MCV 83 09/01/2020   PLT 252 09/01/2020   Lab Results  Component Value Date   NA 136 09/01/2020   K 4.2 09/01/2020   CO2 24 09/01/2020   GLUCOSE 96 09/01/2020   BUN 15 09/01/2020   CREATININE 0.98 09/01/2020   BILITOT 0.7 09/01/2020   ALKPHOS 83 09/01/2020   AST 18 09/01/2020   ALT 18 09/01/2020   PROT 7.0 09/01/2020   ALBUMIN 4.8 09/01/2020   CALCIUM 10.0 09/01/2020   EGFR 105 09/01/2020   Lab Results  Component Value Date   CHOL 149 09/01/2020   Lab Results  Component Value Date   HDL 46 09/01/2020   Lab Results  Component Value Date   LDLCALC 88 09/01/2020   Lab Results  Component Value Date   TRIG 74 09/01/2020   Lab Results  Component Value Date   CHOLHDL 3.2 09/01/2020   Lab Results  Component Value Date   HGBA1C 5.1 09/01/2020      Assessment & Plan:   Problem List Items Addressed This Visit       Digestive   Gastroesophageal reflux disease without esophagitis    Well-compared with Omeprazole      Relevant Orders   CMP14+EGFR     Musculoskeletal and Integument   Eczema    Improved now with OTC nonsteroidal cream      Relevant Orders   TSH   CMP14+EGFR   CBC with Differential/Platelet     Other   Encounter for general adult medical examination with  abnormal findings - Primary    Physical exam as documented. Fasting blood tests ordered.  Other Visit Diagnoses     Hyperglycemia       Relevant Orders   Hemoglobin A1c   Mixed hyperlipidemia       Relevant Orders   Lipid panel   Vitamin D deficiency       Relevant Orders   VITAMIN D 25 Hydroxy (Vit-D Deficiency, Fractures)       No orders of the defined types were placed in this encounter.   Follow-up: Return in about 1 year (around 08/11/2023) for Annual physical.    Anabel Halon, MD

## 2022-08-16 ENCOUNTER — Other Ambulatory Visit (HOSPITAL_COMMUNITY): Payer: Self-pay

## 2022-08-16 ENCOUNTER — Other Ambulatory Visit: Payer: Self-pay | Admitting: Internal Medicine

## 2022-08-16 DIAGNOSIS — E559 Vitamin D deficiency, unspecified: Secondary | ICD-10-CM

## 2022-08-16 MED ORDER — VITAMIN D3 50 MCG (2000 UT) PO CAPS
2000.0000 [IU] | ORAL_CAPSULE | Freq: Every day | ORAL | 3 refills | Status: AC
Start: 2022-08-16 — End: ?
  Filled 2022-08-16: qty 100, 100d supply, fill #0
  Filled 2022-08-23: qty 100, 90d supply, fill #0

## 2022-08-23 ENCOUNTER — Other Ambulatory Visit (HOSPITAL_COMMUNITY): Payer: Self-pay

## 2022-09-04 ENCOUNTER — Other Ambulatory Visit (HOSPITAL_COMMUNITY): Payer: Self-pay

## 2022-09-07 IMAGING — DX DG KNEE COMPLETE 4+V*R*
4 series · 4 of 4 positions shown · non-contrast
Comparison: None.

CLINICAL DATA: Right knee pain.

EXAM:
RIGHT KNEE - COMPLETE 4+ VIEW

[knee ap]
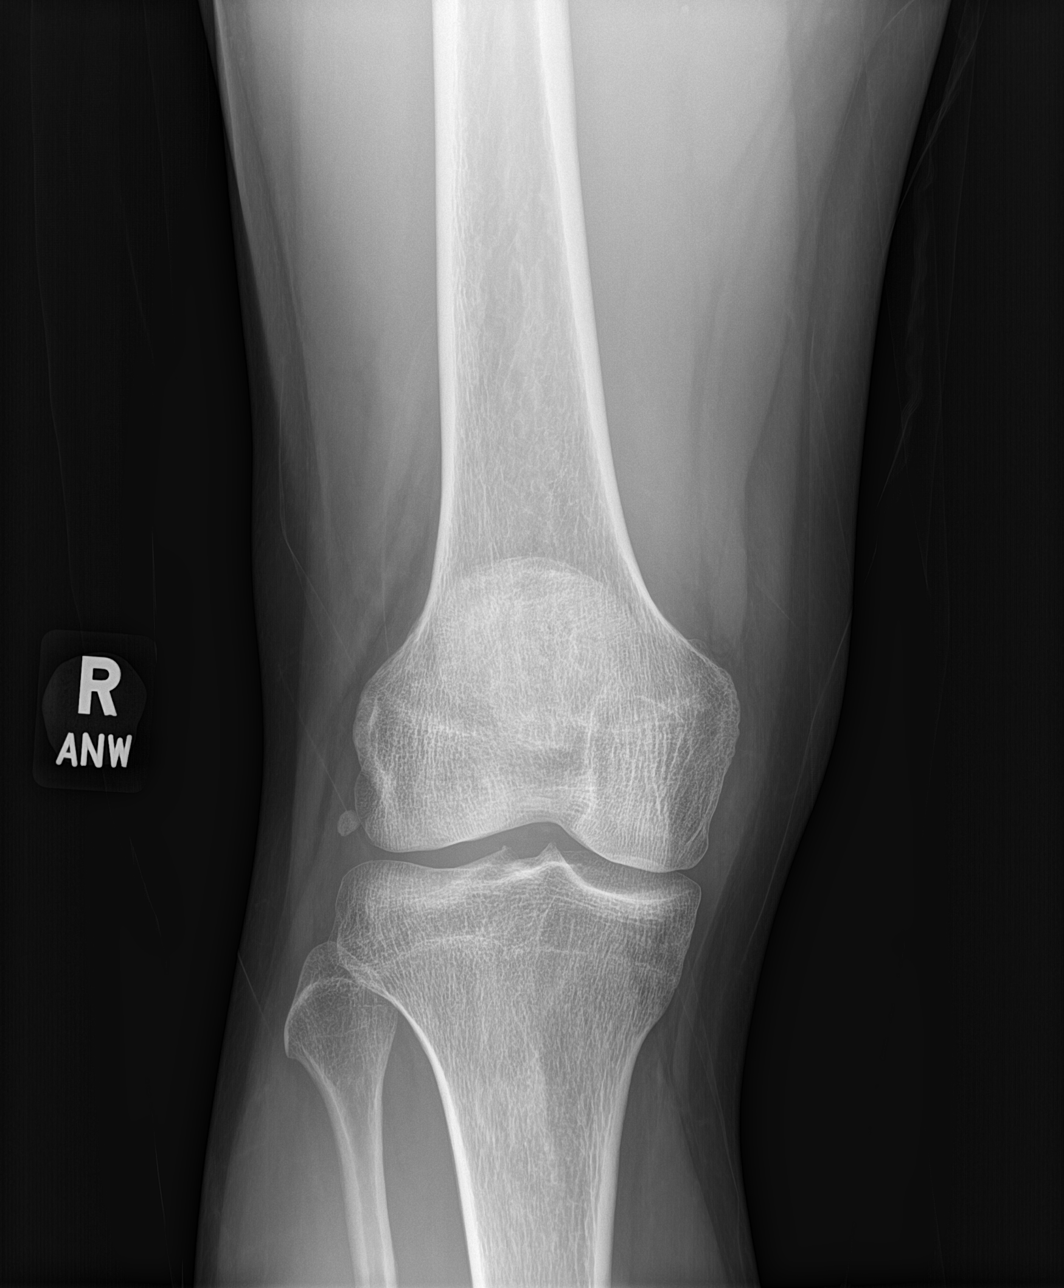

[knee lat]
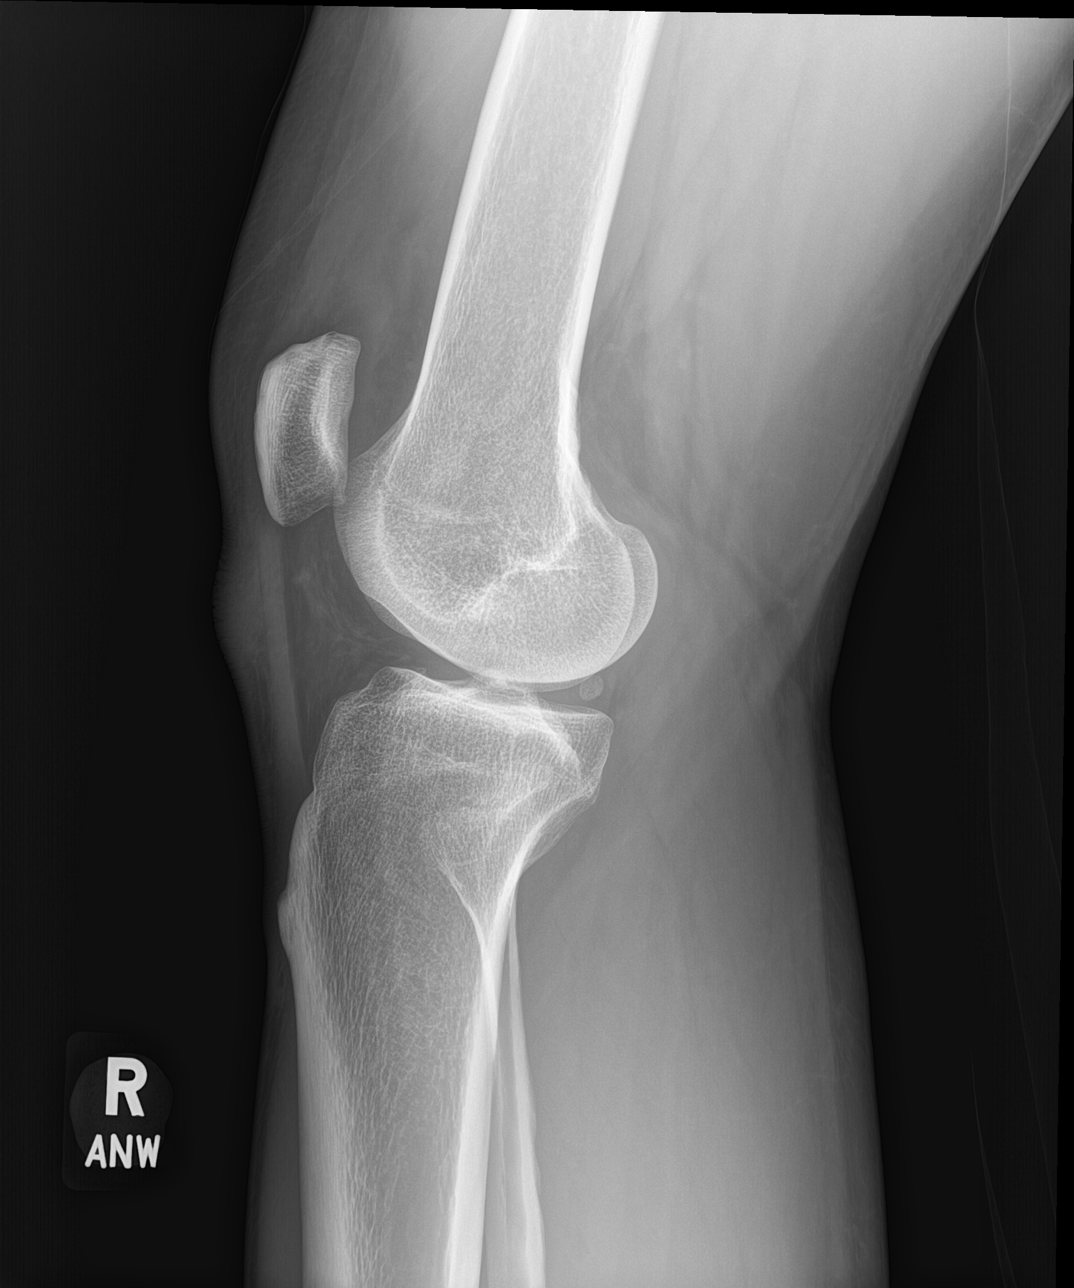

[patella skyline]
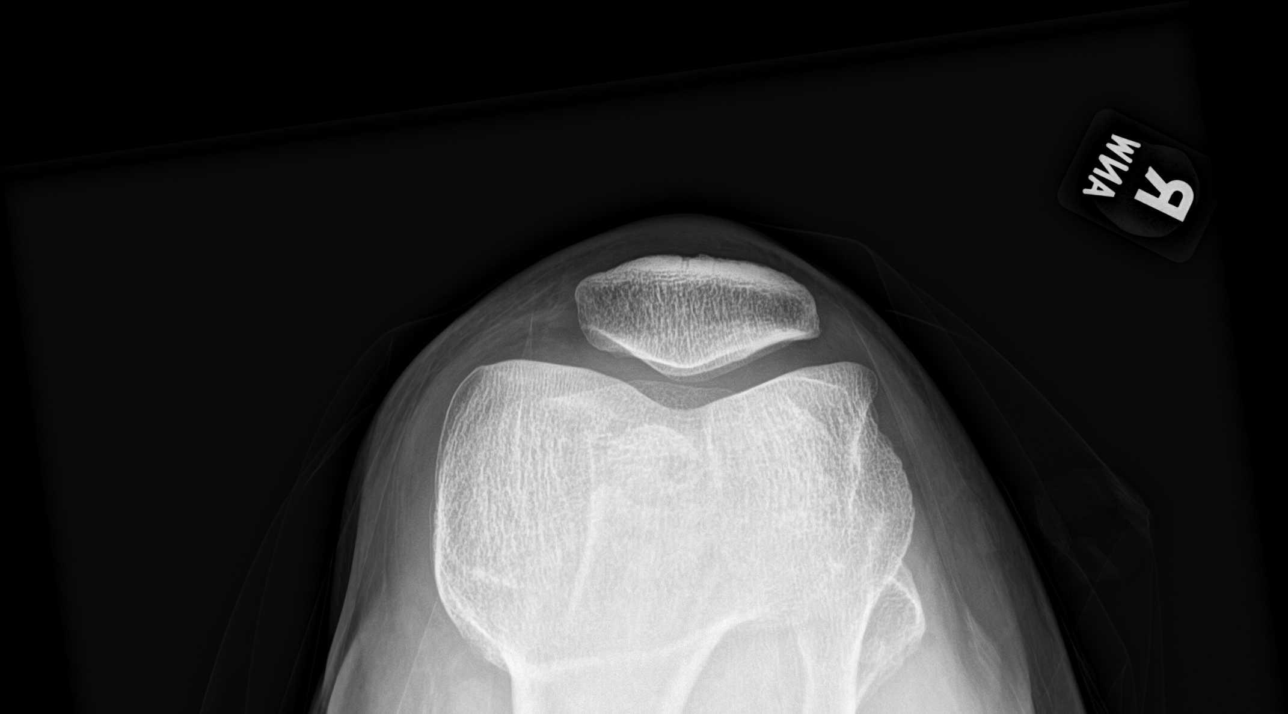

[knee tunnel]
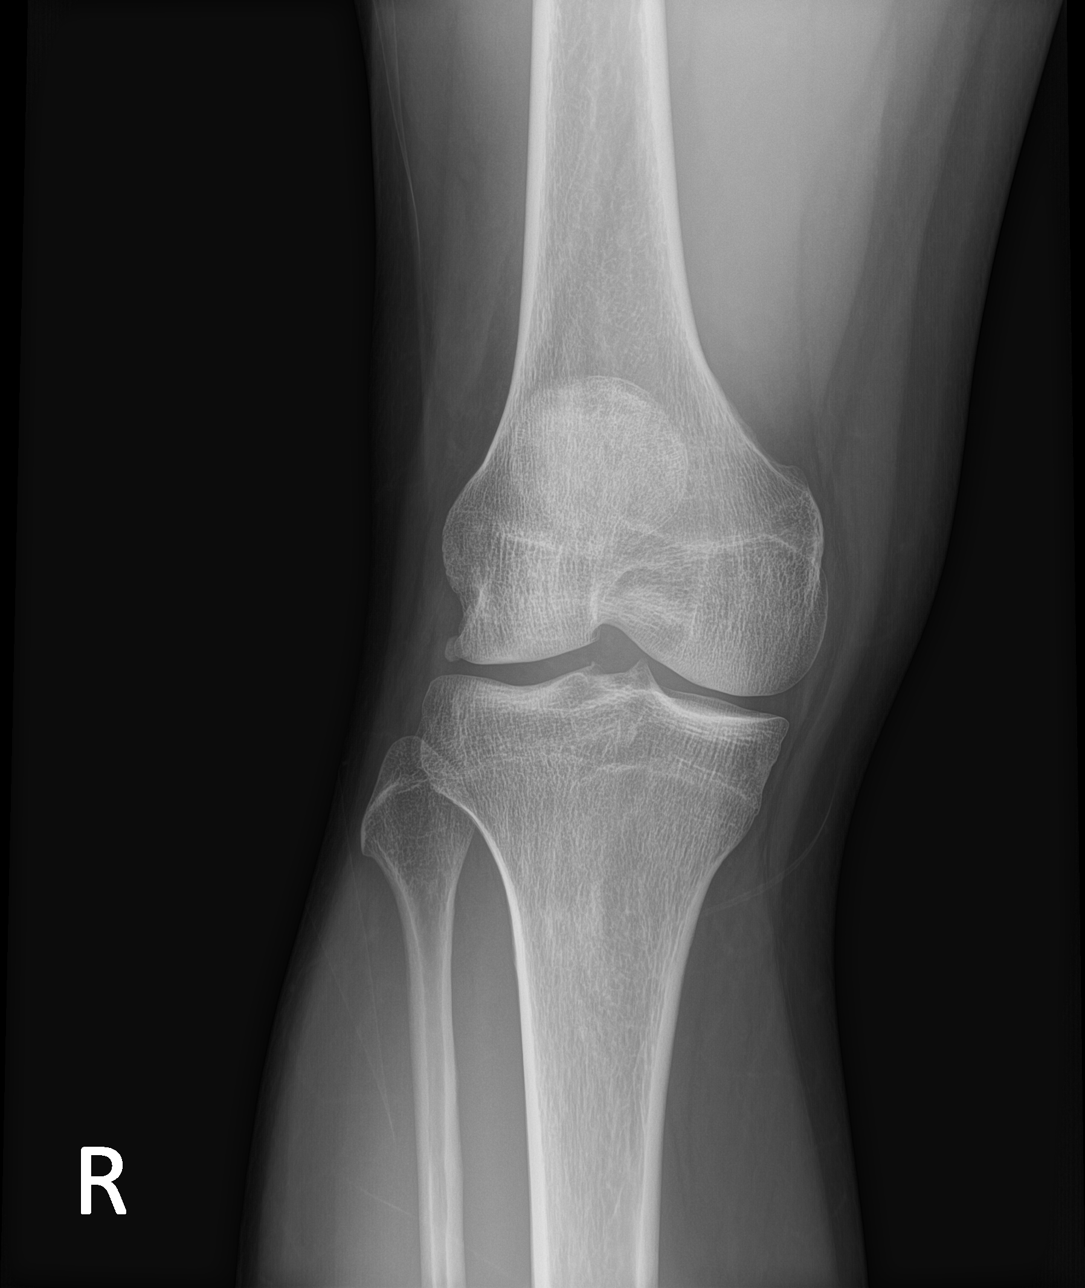

[4 of 4 positions shown; findings below may reference images not displayed]

FINDINGS: No evidence of fracture, dislocation, or joint effusion. No evidence
of arthropathy or other focal bone abnormality. Soft tissues are
unremarkable.
IMPRESSION: Negative.

## 2022-11-12 ENCOUNTER — Other Ambulatory Visit: Payer: Self-pay | Admitting: Internal Medicine

## 2022-11-12 ENCOUNTER — Encounter: Payer: Self-pay | Admitting: Internal Medicine

## 2022-11-12 DIAGNOSIS — H9193 Unspecified hearing loss, bilateral: Secondary | ICD-10-CM | POA: Insufficient documentation

## 2022-12-01 ENCOUNTER — Ambulatory Visit (HOSPITAL_BASED_OUTPATIENT_CLINIC_OR_DEPARTMENT_OTHER): Payer: Commercial Managed Care - PPO

## 2022-12-01 ENCOUNTER — Ambulatory Visit (HOSPITAL_BASED_OUTPATIENT_CLINIC_OR_DEPARTMENT_OTHER): Payer: Commercial Managed Care - PPO | Admitting: Orthopaedic Surgery

## 2022-12-01 DIAGNOSIS — M25521 Pain in right elbow: Secondary | ICD-10-CM

## 2022-12-01 DIAGNOSIS — M7711 Lateral epicondylitis, right elbow: Secondary | ICD-10-CM

## 2022-12-01 MED ORDER — TRIAMCINOLONE ACETONIDE 40 MG/ML IJ SUSP
80.0000 mg | INTRAMUSCULAR | Status: AC | PRN
  Administered 2022-12-01: 80 mg via INTRA_ARTICULAR

## 2022-12-01 MED ORDER — LIDOCAINE HCL 1 % IJ SOLN
4.0000 mL | INTRAMUSCULAR | Status: AC | PRN
  Administered 2022-12-01: 4 mL

## 2022-12-01 NOTE — Progress Notes (Signed)
Chief Complaint: Right elbow pain     History of Present Illness:   12/01/2022: Presents today for follow-up of the right elbow.  He is having persistent lateral based elbow pain particularly as he plays tennis.  He does state that he also does significant forearm prone supination with scoping which is flaring up the elbow quite significantly.  He has been attempting a stretching program for tennis elbow without any relief    Surgical History:   None  PMH/PSH/Family History/Social History/Meds/Allergies:   No past medical history on file. No past surgical history on file. Social History   Socioeconomic History  . Marital status: Married    Spouse name: Not on file  . Number of children: Not on file  . Years of education: Not on file  . Highest education level: Not on file  Occupational History  . Not on file  Tobacco Use  . Smoking status: Never  . Smokeless tobacco: Never  . Tobacco comments:    never smoked  Substance and Sexual Activity  . Alcohol use: Yes    Alcohol/week: 2.0 standard drinks of alcohol    Types: 2 Cans of beer per week  . Drug use: Never  . Sexual activity: Yes    Birth control/protection: Surgical    Comment: Vasectomy  Other Topics Concern  . Not on file  Social History Narrative  . Not on file   Social Determinants of Health   Financial Resource Strain: Not on file  Food Insecurity: Not on file  Transportation Needs: Not on file  Physical Activity: Not on file  Stress: Not on file  Social Connections: Not on file   Family History  Problem Relation Age of Onset  . Asthma Father   . Asthma Brother    Not on File Current Outpatient Medications  Medication Sig Dispense Refill  . Cholecalciferol (VITAMIN D3) 50 MCG (2000 UT) capsule Take 1 capsule (2,000 Units total) by mouth daily. 100 capsule 3  . omeprazole (PRILOSEC) 20 MG capsule Take 1 capsule (20 mg total) by mouth daily. 90 capsule 3   No  current facility-administered medications for this visit.   No results found.  Review of Systems:   A ROS was performed including pertinent positives and negatives as documented in the HPI.  Physical Exam :   Constitutional: NAD and appears stated age Neurological: Alert and oriented Psych: Appropriate affect and cooperative There were no vitals taken for this visit.   Comprehensive Musculoskeletal Exam:    Inspection Right Left  Skin No atrophy or gross abnormalities appreciated No atrophy or gross abnormalities appreciated  Palpation    Tenderness None None  Crepitus None None  Range of Motion    Flexion (passive) 160 160  Flexion (active) 160 160  Extension 0 0  Pronation normal normal  Supination normal normal   Strength    Flexion  5/5 5/5  Extension 5/5 5/5  Pronation  5/5 5/5  Supination  5/5 5/5  Special Tests    Lateral epicondyle pain with resisted wrist extension Positive Negative  Medial epicondyle pain with resisted wrist flexion  Negative Negative  Tinel test over cubital tunnel  Negative  Negative   Instability    Generalized Laxity No No  Varus stress No instability No instability  Valgus stress  No  instability No instability  Reflexes    Triceps Normal (2/4) Normal (2/4)  Brachioradialis Normal (2/4) Normal (2/4)  Biceps Normal (2/4) Normal (2/4)  Neurologic    Fires PIN, radial, median, ulnar, musculocutaneous, axillary, suprascapular, long thoracic, and spinal accessory innervated muscles. No abnormal sensibility.  Vascular/Lymphatic    Radial Pulse 2+ 2+  Cervical Exam    Patient has symmetric cervical range of motion with Negative Spurling's test.    Imaging:     I personally reviewed and interpreted the radiographs.   Assessment:   37 y.o. male with right elbow lateral epicondylitis consistent with tennis elbow.  At today's visit I did recommend an initial ultrasound-guided injection to give him hopefully permanent relief.  I will plan  to see him back as needed  Plan :    -Right elbow ultrasound-guided injection performed today after verbal consent obtained  Procedure Note  Patient: Juan Caldwell             Date of Birth: 08/23/1985           MRN: [id number]             Visit Date: 12/01/2022  Procedures: Visit Diagnoses:  1. Pain in right elbow     Medium Joint Inj: R elbow on 12/01/2022 5:14 PM Indications: pain Details: 22 G 1.5 in needle, ultrasound-guided lateral approach Medications: 4 mL lidocaine 1 %; 80 mg triamcinolone acetonide 40 MG/ML Outcome: tolerated well, no immediate complications Consent was given by the patient. Immediately prior to procedure a time out was called to verify the correct patient, procedure, equipment, support staff and site/side marked as required. Patient was prepped and draped in the usual sterile fashion.         I personally saw and evaluated the patient, and participated in the management and treatment plan.  Huel Cote, MD Attending Physician, Orthopedic Surgery  This document was dictated using Dragon voice recognition software. A reasonable attempt at proof reading has been made to minimize errors.

## 2022-12-02 ENCOUNTER — Encounter (HOSPITAL_BASED_OUTPATIENT_CLINIC_OR_DEPARTMENT_OTHER): Payer: Self-pay | Admitting: Orthopaedic Surgery

## 2022-12-16 ENCOUNTER — Ambulatory Visit: Payer: Commercial Managed Care - PPO | Admitting: Audiologist

## 2023-04-27 ENCOUNTER — Other Ambulatory Visit (HOSPITAL_COMMUNITY): Payer: Self-pay

## 2023-04-27 ENCOUNTER — Other Ambulatory Visit: Payer: Self-pay | Admitting: Internal Medicine

## 2023-04-27 ENCOUNTER — Encounter: Payer: Self-pay | Admitting: Internal Medicine

## 2023-04-27 DIAGNOSIS — L309 Dermatitis, unspecified: Secondary | ICD-10-CM

## 2023-04-27 DIAGNOSIS — K219 Gastro-esophageal reflux disease without esophagitis: Secondary | ICD-10-CM

## 2023-04-27 MED ORDER — OMEPRAZOLE 20 MG PO CPDR
20.0000 mg | DELAYED_RELEASE_CAPSULE | Freq: Every day | ORAL | 3 refills | Status: AC
  Filled 2023-04-27 – 2023-05-06 (×3): qty 90, 90d supply, fill #0

## 2023-04-27 MED ORDER — MOMETASONE FUROATE 0.1 % EX CREA
1.0000 | TOPICAL_CREAM | Freq: Every day | CUTANEOUS | 1 refills | Status: AC
  Filled 2023-04-27 – 2023-05-06 (×3): qty 45, 30d supply, fill #0

## 2023-04-28 ENCOUNTER — Other Ambulatory Visit (HOSPITAL_COMMUNITY): Payer: Self-pay

## 2023-05-06 ENCOUNTER — Other Ambulatory Visit (HOSPITAL_COMMUNITY): Payer: Self-pay

## 2023-05-09 ENCOUNTER — Other Ambulatory Visit (HOSPITAL_COMMUNITY): Payer: Self-pay

## 2023-09-14 DIAGNOSIS — H52203 Unspecified astigmatism, bilateral: Secondary | ICD-10-CM | POA: Diagnosis not present

## 2023-09-14 DIAGNOSIS — H5213 Myopia, bilateral: Secondary | ICD-10-CM | POA: Diagnosis not present

## 2023-11-14 ENCOUNTER — Encounter: Payer: Self-pay | Admitting: Radiology

## 2023-11-28 ENCOUNTER — Other Ambulatory Visit: Payer: Self-pay | Admitting: Internal Medicine

## 2023-11-28 ENCOUNTER — Other Ambulatory Visit (HOSPITAL_COMMUNITY): Payer: Self-pay

## 2023-11-28 DIAGNOSIS — Z117 Encounter for testing for latent tuberculosis infection: Secondary | ICD-10-CM

## 2023-11-28 DIAGNOSIS — B351 Tinea unguium: Secondary | ICD-10-CM

## 2023-11-28 MED ORDER — CICLOPIROX 8 % EX SOLN
Freq: Every day | CUTANEOUS | 0 refills | Status: AC
  Filled 2023-11-28: qty 6.6, 30d supply, fill #0

## 2023-12-01 ENCOUNTER — Ambulatory Visit: Payer: Self-pay | Admitting: Internal Medicine

## 2023-12-01 LAB — QUANTIFERON-TB GOLD PLUS
QuantiFERON Mitogen Value: >10 [IU]/mL
QuantiFERON Nil Value: 0.24 [IU]/mL
QuantiFERON TB1 Ag Value: 0.45 [IU]/mL
QuantiFERON TB2 Ag Value: 0.42 [IU]/mL

## 2023-12-02 ENCOUNTER — Other Ambulatory Visit (HOSPITAL_COMMUNITY): Payer: Self-pay
